# Patient Record
Sex: Female | Born: 2000 | Race: Black or African American | Hispanic: No | Marital: Single | Smoking: Never smoker
Health system: Southern US, Community
[De-identification: ages and names within clinical notes are randomized; demographics above are authoritative.]

---
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
## 2019-03-30 ENCOUNTER — Other Ambulatory Visit: Payer: Self-pay

## 2019-03-30 ENCOUNTER — Emergency Department (HOSPITAL_COMMUNITY)
Admission: EM | Admit: 2019-03-30 | Discharge: 2019-03-30 | Disposition: A | Discharge status: Home / Self Care | Payer: PRIVATE HEALTH INSURANCE | Attending: Emergency Medicine | Admitting: Emergency Medicine

## 2019-03-30 ENCOUNTER — Encounter (HOSPITAL_COMMUNITY): Payer: Self-pay

## 2019-03-30 DIAGNOSIS — M542 Cervicalgia: Secondary | ICD-10-CM | POA: Insufficient documentation

## 2019-03-30 DIAGNOSIS — R221 Localized swelling, mass and lump, neck: Secondary | ICD-10-CM | POA: Diagnosis not present

## 2019-03-30 NOTE — ED Notes (Signed)
Patient verbalizes understanding of discharge instructions. Opportunity for questioning and answers were provided. Armband removed by staff, pt discharged from ED.  

## 2019-03-30 NOTE — Discharge Instructions (Addendum)
Please try to apply warm compresses to your chin, this should help with removal of this or bringing this up to its head.  We will monitor and wait prior to starting any therapy. If you experience fever, trouble swallowing or worsening symptoms return to the ED.

## 2019-03-30 NOTE — ED Triage Notes (Signed)
Pt arrives POV for eval of chin pain/swelling onset 3 days PTA. Reports she thought it was from the way she slept, but noted it was not improving. Now c/o bump and swelling to chin x 2 days. No oral/airway involved, NARD in triage.

## 2019-03-30 NOTE — ED Provider Notes (Signed)
[facility] EMERGENCY DEPARTMENT Provider Note   CSN: [id number] Arrival date & time: 03/30/19  1242     History Chief Complaint  Patient presents with  . Neck Pain  . Chin Swelling    Peggy Swanson is a 19 y.o. female.  19 y.o female with no PMH presents to the ED with a chief complaint of "lump" under the chin x 4 days. Patient reports she first noted this after taking a nap, reports she doesn't usually lay on her back for naps therefore was unsure if this caused the lump. She has not tried any therapy for improvement in her symptoms.She reports tenderness with palpation of the wound, no difficulty swallowing, no trouble with speech, no sore throat or fevers.   The history is provided by the patient.  Neck Pain Associated symptoms: no fever        History reviewed. No pertinent past medical history.  Patient Active Problem List   Diagnosis Date Noted  . Left knee pain 03/03/2017  . Right knee pain 05/15/2016  . Shin splints, initial encounter 05/15/2016  . Low back pain 05/15/2016    History reviewed. No pertinent surgical history.   OB History    Gravida  0   Para  0   Term  0   Preterm  0   AB  0   Living  0     SAB  0   TAB  0   Ectopic  0   Multiple  0   Live Births  0           Family History  Problem Relation Age of Onset  . Diabetes Mother   . Cancer Father   . Breast cancer Paternal Aunt     Social History   Tobacco Use  . Smoking status: Never Smoker  . Smokeless tobacco: Never Used  Substance Use Topics  . Alcohol use: No  . Drug use: No    Home Medications Prior to Admission medications   Medication Sig Start Date End Date Taking? Authorizing Provider  dicyclomine (BENTYL) 20 MG tablet Take 1 tablet (20 mg total) by mouth 2 (two) times daily. 07/25/18   Elvina Sidle, MD  famotidine (PEPCID) 20 MG tablet Take 1 tablet (20 mg total) by mouth 2 (two) times daily. 07/25/18   Elvina Sidle, MD    omeprazole (PRILOSEC) 20 MG capsule Take 1 capsule (20 mg total) by mouth daily. 07/26/18   Palumbo, April, MD  sucralfate (CARAFATE) 1 g tablet Take 1 tablet (1 g total) by mouth 4 (four) times daily -  with meals and at bedtime for 7 days. 07/29/18 08/05/18  Virgina Norfolk, DO    Allergies    Patient has no known allergies.  Review of Systems   Review of Systems  Constitutional: Negative for fever.  HENT: Negative for trouble swallowing.   Respiratory: Negative for shortness of breath.   Musculoskeletal: Positive for neck pain.  Skin: Negative for color change and wound.    Physical Exam Updated Vital Signs BP 114/75 (BP Location: Right Arm)   Pulse (!) 59   Temp 98.1 F (36.7 C)   Resp 18   Ht 5' 10.5" (1.791 m)   Wt 67.1 kg   SpO2 100%   BMI 20.94 kg/m   Physical Exam Vitals and nursing note reviewed.  Constitutional:      Appearance: Normal appearance.  HENT:     Head: Normocephalic and atraumatic.  Comments: No erythema, no swelling, no  Wound noted, palpable firm movable lesion noted.     Nose: Nose normal.     Mouth/Throat:     Mouth: Mucous membranes are moist.     Comments: Pharynx is clear, uvula is midline, no tonsillar exudates, no PTA noted.  Eyes:     Pupils: Pupils are equal, round, and reactive to light.  Cardiovascular:     Rate and Rhythm: Normal rate.  Pulmonary:     Effort: Pulmonary effort is normal.     Breath sounds: No wheezing.  Abdominal:     General: Abdomen is flat.  Musculoskeletal:     Cervical back: Normal range of motion and neck supple. No rigidity.  Lymphadenopathy:     Cervical: No cervical adenopathy.  Skin:    General: Skin is warm and dry.  Neurological:     Mental Status: She is alert and oriented to person, place, and time.     ED Results / Procedures / Treatments   Labs (all labs ordered are listed, but only abnormal results are displayed) Labs Reviewed - No data to display  EKG None  Radiology No  results found.  Procedures Procedures (including critical care time)  Medications Ordered in ED Medications - No data to display  ED Course  I have reviewed the triage vital signs and the nursing notes.  Pertinent labs & imaging results that were available during my care of the patient were reviewed by me and considered in my medical decision making (see chart for details).    MDM Rules/Calculators/A&P   Doing a pertinent past medical history presents to the ED with complaints of lesion under her chin.  Reports this is tender to palpation, there is no erythema, swelling, or edema noted on my exam.  Phonation is intact, oropharynx is clear without any swelling, uvula remains midline.   She is able to speak in full sentences, no fevers, neck rigidity, cervical adenopathy.  Suspicion of reactive sublingual node versus abscess.  No head present, no Lakshman's noted.    Advised patient to apply warm compresses to the area along with monitor the watch and wait.  She is nontoxic-appearing, afebrile, will have her follow up with PCP or return to the ED if worsen. Return precautions discussed at length.    Portions of this note were generated with [employer]. Dictation errors may occur despite best attempts at proofreading.  Final Clinical Impression(s) / ED Diagnoses Final diagnoses:  Neck pain    Rx / DC Orders ED Discharge Orders    None       Janeece Fitting, PA-C 03/30/19 Krakow, Adam, DO 03/30/19 1513

## 2019-09-11 ENCOUNTER — Other Ambulatory Visit: Payer: Self-pay

## 2019-09-11 DIAGNOSIS — R064 Hyperventilation: Secondary | ICD-10-CM | POA: Insufficient documentation

## 2019-09-11 DIAGNOSIS — Z5321 Procedure and treatment not carried out due to patient leaving prior to being seen by health care provider: Secondary | ICD-10-CM | POA: Insufficient documentation

## 2019-09-11 DIAGNOSIS — F419 Anxiety disorder, unspecified: Secondary | ICD-10-CM | POA: Diagnosis present

## 2019-09-11 DIAGNOSIS — R252 Cramp and spasm: Secondary | ICD-10-CM | POA: Diagnosis not present

## 2019-09-11 NOTE — ED Triage Notes (Signed)
Pt with history of anxiety attacks.

## 2019-09-11 NOTE — ED Triage Notes (Addendum)
Pt arrives with ptar with anxiety and post hyperventilation. Pt with cramping in hands noted. Ems states pt was hyperventilating. Pt is calm at this time, but continues to complain of hand cramps. Pt denies chest pain.

## 2019-09-12 ENCOUNTER — Emergency Department
Admission: EM | Admit: 2019-09-12 | Discharge: 2019-09-12 | Disposition: A | Discharge status: Home / Self Care | Payer: Medicaid Other | Attending: Emergency Medicine | Admitting: Emergency Medicine

## 2019-09-12 NOTE — ED Notes (Signed)
Observed walking out of the ED lobby, did not speak to staff.

## 2019-09-12 NOTE — ED Notes (Signed)
Patient not in lobby or outside.

## 2019-09-12 NOTE — ED Notes (Signed)
Patient has not returned to lobby

## 2020-06-01 IMAGING — CT CT ABDOMEN AND PELVIS WITH CONTRAST
2 of 4 series · 17 of 46 positions shown, 19 images · IV contrast (APPLIED)
Comparison: 01/08/2017

CLINICAL DATA: Right lower quadrant pain on and off for 1 week

EXAM:
CT ABDOMEN AND PELVIS WITH CONTRAST
TECHNIQUE: Multidetector CT imaging of the abdomen and pelvis was performed
using the standard protocol following bolus administration of
intravenous contrast.
CONTRAST:  100mL OMNIPAQUE IOHEXOL 300 MG/ML  SOLN

[Series 2: axial st · axial · 0.66mm/px · z∈[-452,-42]mm · 14 of 90 slices shown, 16 images]
[im 4/90  soft-tissue]
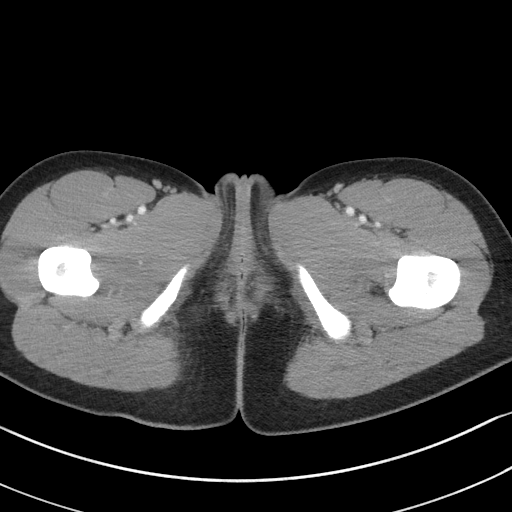
[im 4/90  bone]
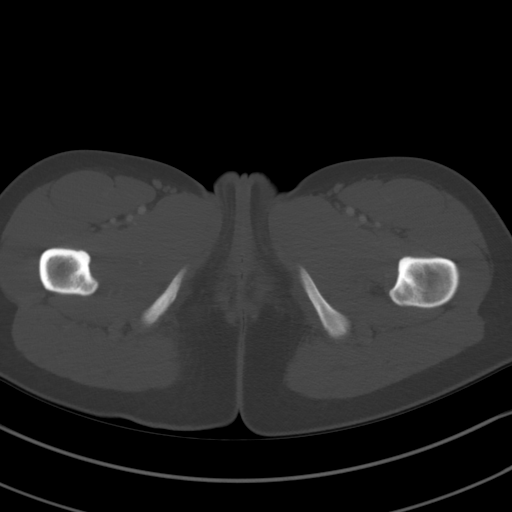
[im 10/90  soft-tissue]
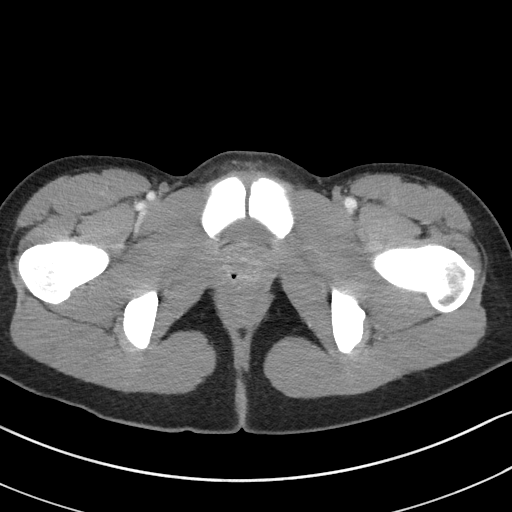
[im 16/90  soft-tissue]
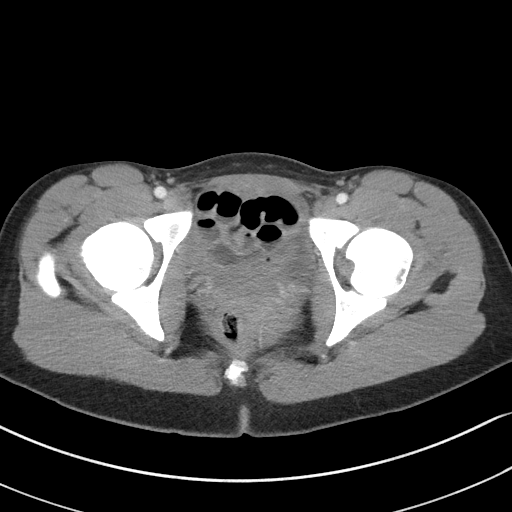
[im 23/90  soft-tissue]
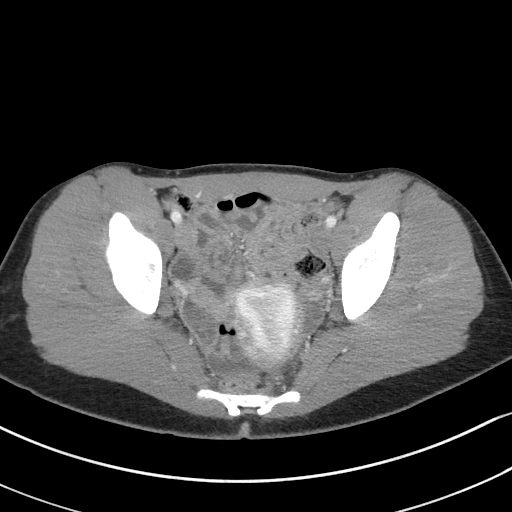
[im 29/90  soft-tissue]
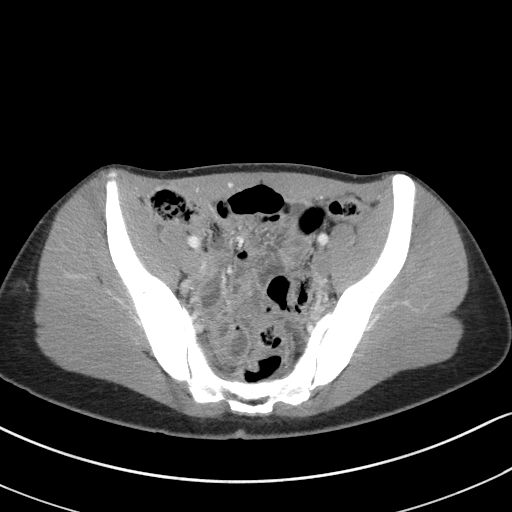
[im 35/90  soft-tissue]
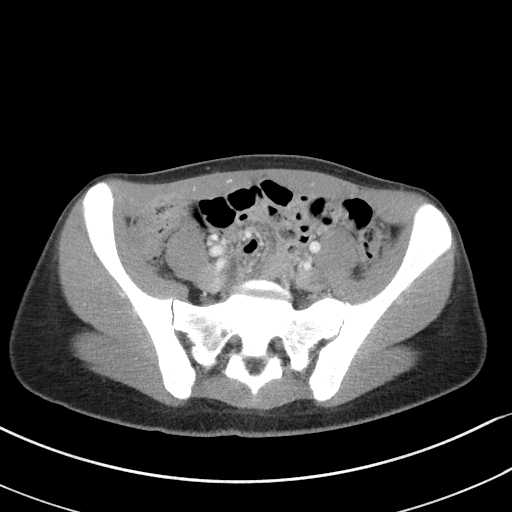
[im 42/90  soft-tissue]
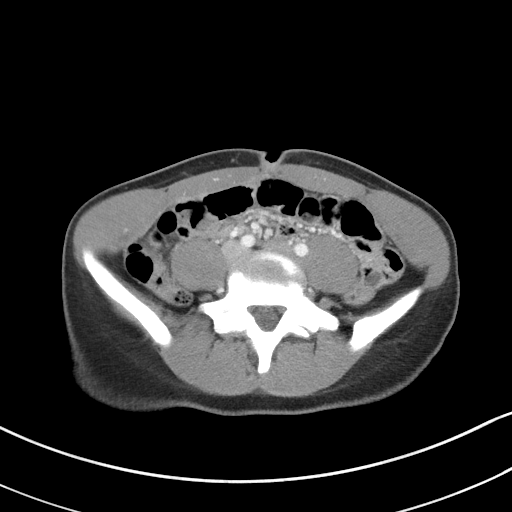
[im 48/90  soft-tissue]
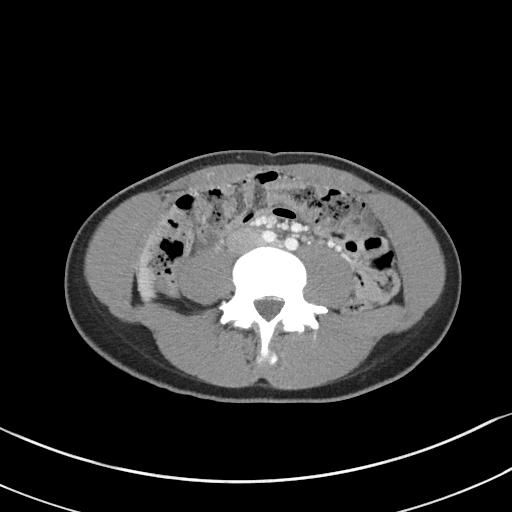
[im 55/90  soft-tissue]
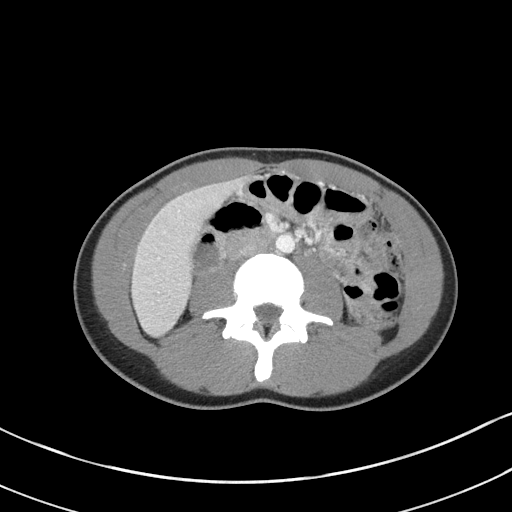
[im 55/90  bone]
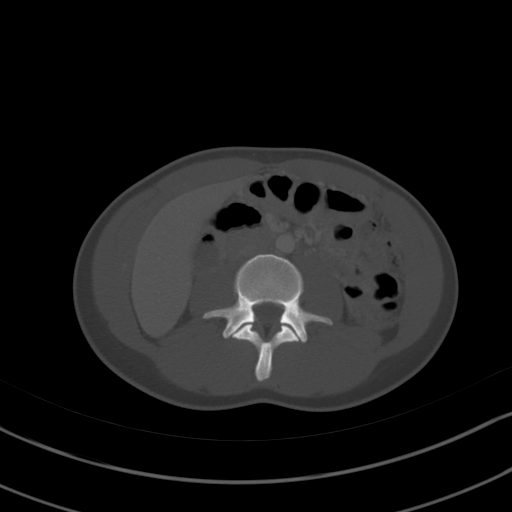
[im 61/90  soft-tissue]
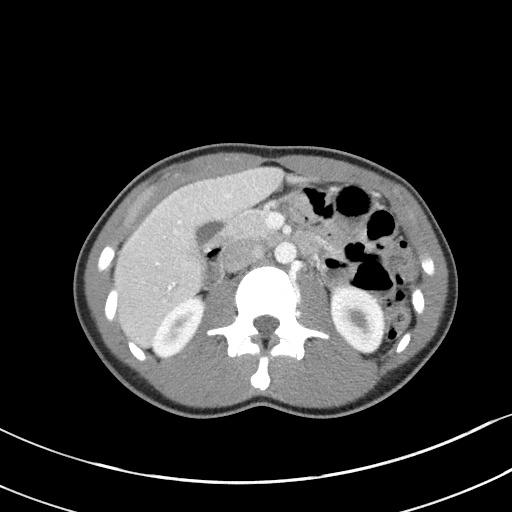
[im 67/90  soft-tissue]
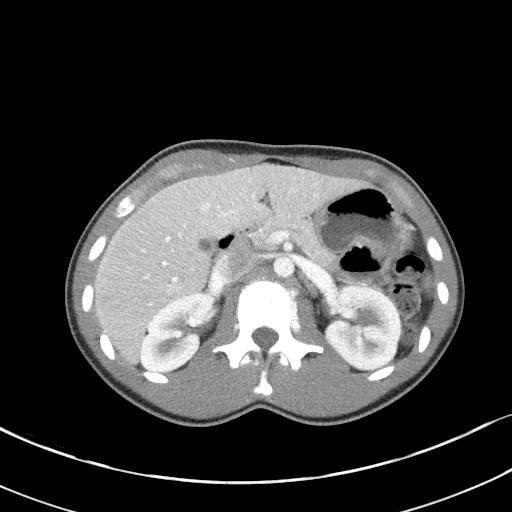
[im 74/90  soft-tissue]
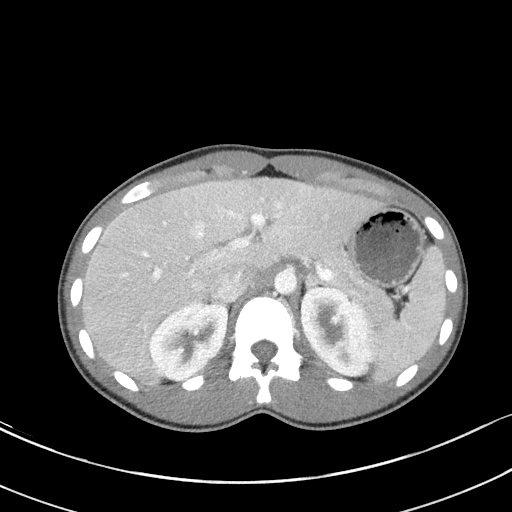
[im 80/90  soft-tissue]
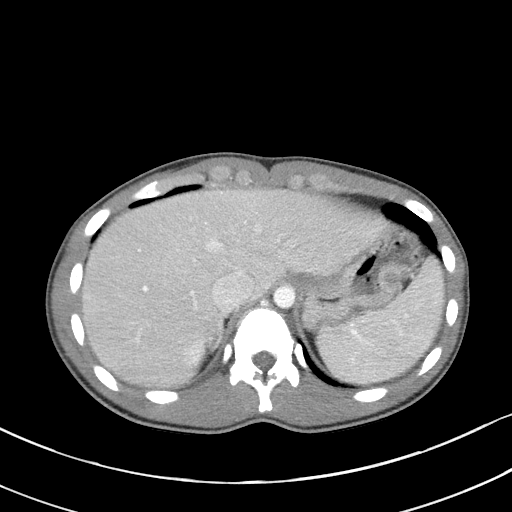
[im 86/90  soft-tissue]
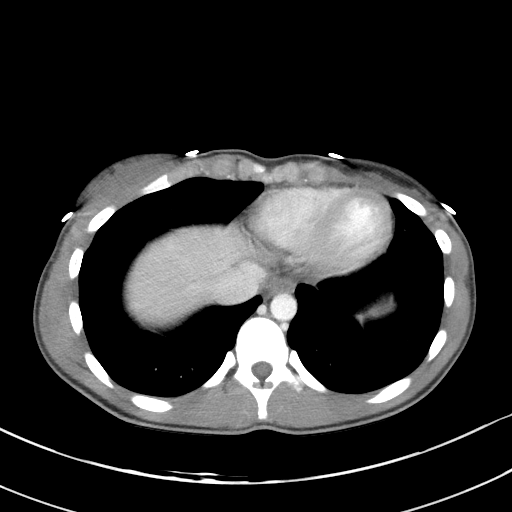

[Series 5: coronal st · coronal · 0.72mm/px · 3 of 64 slices shown]
[im 22/64  soft-tissue]
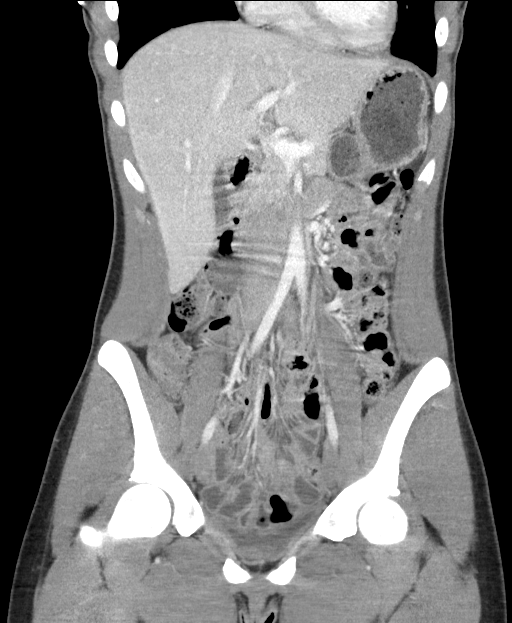
[im 29/64  soft-tissue]
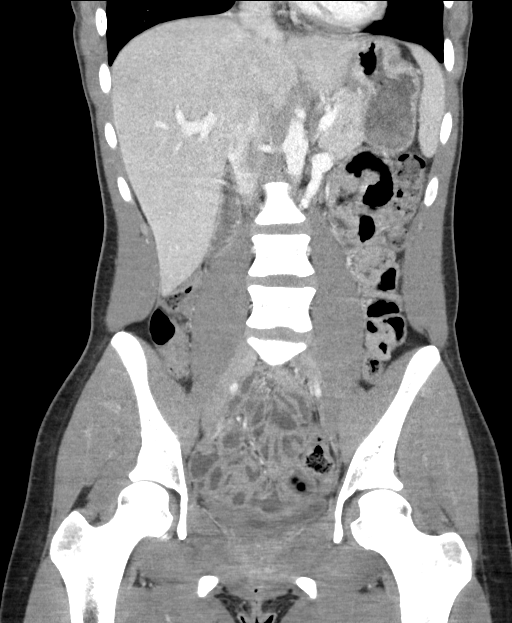
[im 36/64  soft-tissue]
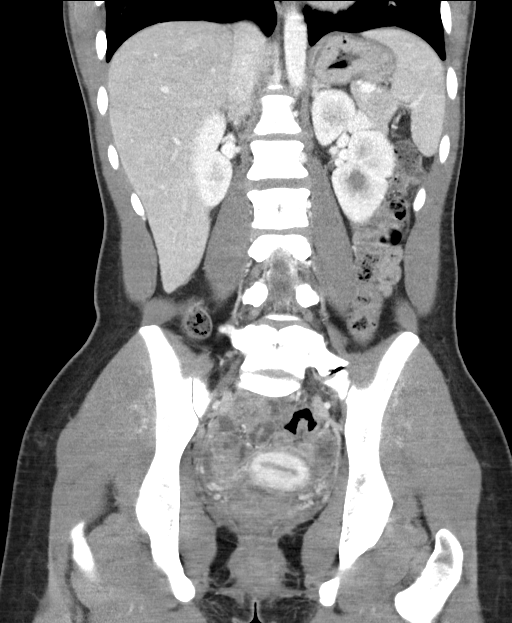

[17 of 46 positions shown; findings below may reference images not displayed]

FINDINGS: Lower chest:  No contributory findings.

Hepatobiliary: No focal liver abnormality.No evidence of biliary
obstruction or stone.

Pancreas: Unremarkable.

Spleen: Unremarkable.

Adrenals/Urinary Tract: Negative adrenals. No hydronephrosis or
stone. Unremarkable bladder.

Stomach/Bowel: No obstruction. Best seen on axial slices the
appendix is noted in the right lower quadrant and negative for
inflammation.

Vascular/Lymphatic: No acute vascular abnormality. No mass or
adenopathy.

Reproductive:No pathologic findings.

Other: No ascites or pneumoperitoneum.

Musculoskeletal: No acute abnormalities.
IMPRESSION: Negative abdominal CT.

## 2020-07-30 ENCOUNTER — Emergency Department (HOSPITAL_COMMUNITY)
Admission: EM | Admit: 2020-07-30 | Discharge: 2020-07-30 | Disposition: A | Discharge status: Home / Self Care | Payer: 59 | Attending: Emergency Medicine | Admitting: Emergency Medicine

## 2020-07-30 ENCOUNTER — Other Ambulatory Visit: Payer: Self-pay

## 2020-07-30 ENCOUNTER — Encounter (HOSPITAL_COMMUNITY): Payer: Self-pay

## 2020-07-30 DIAGNOSIS — R319 Hematuria, unspecified: Secondary | ICD-10-CM | POA: Insufficient documentation

## 2020-07-30 DIAGNOSIS — R309 Painful micturition, unspecified: Secondary | ICD-10-CM | POA: Insufficient documentation

## 2020-07-30 DIAGNOSIS — N898 Other specified noninflammatory disorders of vagina: Secondary | ICD-10-CM | POA: Insufficient documentation

## 2020-07-30 DIAGNOSIS — R300 Dysuria: Secondary | ICD-10-CM

## 2020-07-30 LAB — I-STAT BETA HCG BLOOD, ED (MC, WL, AP ONLY): I-stat hCG, quantitative: <5 m[IU]/mL (ref <5)

## 2020-07-30 LAB — URINALYSIS, ROUTINE W REFLEX MICROSCOPIC
Bacteria, UA: NONE SEEN
Bilirubin Urine: NEGATIVE
Glucose, UA: NEGATIVE mg/dL
Ketones, ur: 5 mg/dL — AB
Leukocytes,Ua: NEGATIVE
Nitrite: NEGATIVE
Protein, ur: NEGATIVE mg/dL
Specific Gravity, Urine: 1.017 (ref 1.005–1.030)
pH: 5 (ref 5.0–8.0)

## 2020-07-30 MED ORDER — CEPHALEXIN 500 MG PO CAPS
500.0000 mg | ORAL_CAPSULE | Freq: Once | ORAL | Status: AC
  Administered 2020-07-30: 500 mg via ORAL
  Filled 2020-07-30: qty 1

## 2020-07-30 MED ORDER — CEPHALEXIN 500 MG PO CAPS: 500.0000 mg | ORAL_CAPSULE | Freq: Four times a day (QID) | ORAL | 0 refills | Status: AC

## 2020-07-30 NOTE — ED Notes (Signed)
Light green and Lavender top tube sent to lab. Urine culture sent to lab.

## 2020-07-30 NOTE — ED Triage Notes (Signed)
Patient c/o urinary frequency, foul odor to her urine, and saw a small amount of blood when she wiped after urinating. Symptoms started yesterday.

## 2020-07-30 NOTE — ED Provider Notes (Signed)
[facility]-EMERGENCY DEPT Provider Note   CSN: [id number] Arrival date & time: 07/30/20  1748     History Chief Complaint  Patient presents with   Urinary Frequency   Hematuria    Peggy Swanson is a 20 y.o. female who presents with concern for urinary frequency, urgency, decreased urine, and foul odor to her urine x24 hours.  States that this afternoon she noticed scant amount of blood on the toilet tissue when she wiped after urinating.  Additionally is endorsing pain with urination while in the emergency department.  The states he feels similar to when she had a UTI in the past.  She denies any vaginal bleeding or discharge, most recent period was approximately 3 weeks ago.  Denies any fevers, chills, nausea, vomiting, diarrhea.  I personally reviewed this patient's medical records.  She has history of musculoskeletal injuries, is a Engineer, building services in college, otherwise carries no medical diagnoses and is not on any medications every day.  HPI     History reviewed. No pertinent past medical history.  Patient Active Problem List   Diagnosis Date Noted   Left knee pain 03/03/2017   Right knee pain 05/15/2016   Shin splints, initial encounter 05/15/2016   Low back pain 05/15/2016    History reviewed. No pertinent surgical history.   OB History     Gravida  0   Para  0   Term  0   Preterm  0   AB  0   Living  0      SAB  0   IAB  0   Ectopic  0   Multiple  0   Live Births  0           Family History  Problem Relation Age of Onset   Diabetes Mother    Cancer Father    Breast cancer Paternal Aunt     Social History   Tobacco Use   Smoking status: Never   Smokeless tobacco: Never  Vaping Use   Vaping Use: Never used  Substance Use Topics   Alcohol use: No   Drug use: No    Home Medications Prior to Admission medications   Medication Sig Start Date End Date Taking? Authorizing Provider  cephALEXin (KEFLEX) 500 MG  capsule Take 1 capsule (500 mg total) by mouth 4 (four) times daily for 5 days. 07/30/20 08/04/20 Yes Amardeep Beckers, Eugene Gavia, PA-C  dicyclomine (BENTYL) 20 MG tablet Take 1 tablet (20 mg total) by mouth 2 (two) times daily. 07/25/18   Elvina Sidle, MD  famotidine (PEPCID) 20 MG tablet Take 1 tablet (20 mg total) by mouth 2 (two) times daily. 07/25/18   Elvina Sidle, MD  omeprazole (PRILOSEC) 20 MG capsule Take 1 capsule (20 mg total) by mouth daily. 07/26/18   Palumbo, April, MD  sucralfate (CARAFATE) 1 g tablet Take 1 tablet (1 g total) by mouth 4 (four) times daily -  with meals and at bedtime for 7 days. 07/29/18 08/05/18  Virgina Norfolk, DO    Allergies    Patient has no known allergies.  Review of Systems   Review of Systems  Constitutional: Negative.   HENT: Negative.    Respiratory: Negative.    Cardiovascular: Negative.   Gastrointestinal: Negative.   Genitourinary:  Positive for decreased urine volume, dysuria, frequency, hematuria and urgency. Negative for difficulty urinating, dyspareunia, enuresis, flank pain, genital sores, menstrual problem, pelvic pain, vaginal bleeding, vaginal discharge and vaginal pain.  Musculoskeletal: Negative.  Neurological: Negative.    Physical Exam Updated Vital Signs BP 127/83 (BP Location: Right Arm)   Pulse 70   Temp 98.5 F (36.9 C) (Oral)   Resp 18   Ht 5\' 10"  (1.778 m)   Wt 65.8 kg   LMP 07/04/2020 (Approximate)   SpO2 100%   BMI 20.81 kg/m   Physical Exam Vitals and nursing note reviewed.  Constitutional:      Appearance: She is normal weight. She is not ill-appearing, toxic-appearing or diaphoretic.  HENT:     Head: Normocephalic and atraumatic.     Mouth/Throat:     Mouth: Mucous membranes are moist.     Pharynx: No oropharyngeal exudate or posterior oropharyngeal erythema.  Eyes:     General:        Right eye: No discharge.        Left eye: No discharge.     Extraocular Movements: Extraocular movements intact.      Conjunctiva/sclera: Conjunctivae normal.     Pupils: Pupils are equal, round, and reactive to light.  Cardiovascular:     Rate and Rhythm: Normal rate and regular rhythm.     Pulses: Normal pulses.     Heart sounds: Normal heart sounds. No murmur heard. Pulmonary:     Effort: Pulmonary effort is normal. No respiratory distress.     Breath sounds: Normal breath sounds. No wheezing or rales.  Abdominal:     General: Bowel sounds are normal. There is no distension.     Palpations: Abdomen is soft.     Tenderness: There is abdominal tenderness in the suprapubic area. There is no guarding or rebound.  Musculoskeletal:        General: No deformity.     Cervical back: Neck supple. No rigidity.     Right lower leg: No edema.     Left lower leg: No edema.  Skin:    General: Skin is warm and dry.     Capillary Refill: Capillary refill takes less than 2 seconds.  Neurological:     General: No focal deficit present.     Mental Status: She is alert and oriented to person, place, and time. Mental status is at baseline.  Psychiatric:        Mood and Affect: Mood normal.    ED Results / Procedures / Treatments   Labs (all labs ordered are listed, but only abnormal results are displayed) Labs Reviewed  URINALYSIS, ROUTINE W REFLEX MICROSCOPIC - Abnormal; Notable for the following components:      Result Value   APPearance HAZY (*)    Hgb urine dipstick MODERATE (*)    Ketones, ur 5 (*)    All other components within normal limits  I-STAT BETA HCG BLOOD, ED ([facility], [facility], [facility] ONLY)    EKG None  Radiology No results found.  Procedures Procedures   Medications Ordered in ED Medications  cephALEXin (KEFLEX) capsule 500 mg (500 mg Oral Given 07/30/20 2110)    ED Course  I have reviewed the triage vital signs and the nursing notes.  Pertinent labs & imaging results that were available during my care of the patient were reviewed by me and considered in my medical decision making (see chart  for details).    MDM Rules/Calculators/A&P                         20 year old female who presents with concern for 24 hours of dysuria with episode of hematuria.  Denies any vaginal bleeding or discharge.  Differential diagnosis includes is not mended to cystitis/pyelonephritis/STD, nephrolithiasis/ureterolithiasis, urethral injury, abnormal uterine bleeding, vaginitis.  Vital signs are normal intake.  Cardiopulmonary exam is normal, abdominal exam is significant only for mild suprapubic tenderness palpation without guarding or rebound.  Patient is neurovascularly intact in all 4 extremities.  UA reveals hazy appearing urine with moderate amount of hemoglobin and ketonuria, without evidence of bacteria or pyuria.  She is not pregnant.  STI testing and GU exam offered, however patient declined.  We will proceed with antibiotic therapy for dysuria at this time.  No further work-up warranted in the ED at this time given reassuring physical exam and vital signs.  Mykaylah voiced understanding for medical evaluation and treatment plan.  Each of her questions was answered to her expressed satisfaction.  Return precautions are given.  Patient is well-appearing, stable, and appropriate for discharge at this time.  This chart was dictated using voice recognition software, Dragon. Despite the best efforts of this provider to proofread and correct errors, errors may still occur which can change documentation meaning.  Final Clinical Impression(s) / ED Diagnoses Final diagnoses:  None    Rx / DC Orders ED Discharge Orders          Ordered    cephALEXin (KEFLEX) 500 MG capsule  4 times daily        07/30/20 2047             Averianna Brugger, Idelia Salm 07/30/20 2254    Benjiman Core, MD 07/30/20 2358

## 2020-07-30 NOTE — Discharge Instructions (Addendum)
You were seen in the ER today for your discomfort when urinating and the blood in your urine.  Your physical exam and vital signs are reassuring, however your urine test did reveal signs of early infection.  You have been prescribed antibiotic called cephalexin to take 4 times a day for the next 5 days.  Please follow-up with your OB/GYN or primary care doctor for recheck of your urine after completion of antibiotics to ensure clearance of infection.  Return to the ER if develop any nausea, vomiting, fevers, chills, or sudden severe symptoms.

## 2023-02-04 NOTE — L&D Delivery Note (Signed)
 [facility] Delivery Note: Vaginal   Review the Delivery Report for details. <redacted file path>   Peggy Swanson is a 23 y.o., now G1P0 with an estimated date of delivery of 11/16/23, who delivered at [redacted]w[redacted]d gestation. The patient is GBS negative. Patient had cervical ripening  Membrane rupture occurred at 1:55 PM on 10/28/2023, and the amniotic fluid was clear.  With none anesthesia, the patient's labor was induced with MISOPROSTOL;FOLEY/EASI;OXYTOCIN    Delivery Procedure NICU called to bedside for delivery due to [facility] Labor progressed, the fetal head descended, and was carefully delivered from a occiput anterior position. With additional maternal expulsive effort, the shoulders and remainder of the fetus were atraumatically delivered.  A controlled vaginal delivery was completed at 3:28 PM on 10/28/2023. The baby was a living , female infant, with APGAR scores of 8 at 1 min / 9 at 5 min, delivered by Vaginal, Spontaneous from an occiput anterior position.  After an appropriate delay, the cord was doubly clamped and cut. The umbilical cord was found to have 3 vessels. Skin to Skin was initiated at 3:28 PM. The placenta was spontaneousand appeared intact. Pitocin was given immediately after delivery. Fundal massage was performed, and the fundus was firm.  Episiotomy was not done. Perineum, vagina, labia, and cervix were inspected and found to have: vaginal abrasion, hemostatic.   Hemostasis was confirmed on final inspection. Final sponge, needle, and instrument counts were reported and correct.   Delivery Complications: None  Total estimated blood loss (mL): 75 Total quantitative blood loss (mL):    The patient tolerated the delivery well and is in good and stable condition.  Electronically Signed: Anette DELENA Sharps, CNM 10/28/2023 / 3:49 PM

## 2023-05-25 NOTE — Progress Notes (Signed)
 RETURN OB VISIT SUMMARY  23 y.o. G1P0 at [redacted]w[redacted]d  SUBJECTIVE: The patient has no unusual complaints.  OBJECTIVE: Physical Exam: SEE PRENATAL FLOWSHEET Chaperone present for exam: Maceo Spell, CMA GENERAL APPEARANCE: alert, well appearing, in no apparent distress ABDOMEN: FHT present EXTERNAL GENITALIA: normal appearing vulva with no masses, tenderness or lesions VAGINA: no abnormal discharge or lesions CERVIX: no lesions or cervical motion tenderness  ASSESSMENT: 1. Encounter for prenatal care in second trimester of first pregnancy (*)   2. Sickle cell trait     PLAN: Routine prenatal care Mat21 WNL, defers AFP  Pap today   RTC 4 weeks ROB with anatomy US 

## 2023-06-25 NOTE — Progress Notes (Signed)
 RETURN OB VISIT SUMMARY  23 y.o. G1P0 at [redacted]w[redacted]d   SUBJECTIVE: The patient has no unusual complaints.  OBJECTIVE: Physical Exam: SEE PRENATAL FLOWSHEET  ASSESSMENT: Normal pregnancy  PLAN: Routine prenatal care  Declines AFP

## 2023-07-23 NOTE — Progress Notes (Signed)
 RETURN OB VISIT SUMMARY  23 y.o. G1P0 at [redacted]w[redacted]d   SUBJECTIVE: The patient has no unusual complaints.  OBJECTIVE: Physical Exam: SEE PRENATAL FLOWSHEET GENERAL APPEARANCE: alert, well appearing, in no apparent distress ABDOMEN: FHT present  ASSESSMENT: Normal pregnancy  PLAN: Routine prenatal care  RTO 4wk ROB, glucola, f/u anatomy US 

## 2023-08-20 NOTE — Progress Notes (Signed)
 RETURN OB VISIT SUMMARY  23 y.o. G1P0 at [redacted]w[redacted]d -- High risk pregnancy due to  1. Prenatal care, third trimester (*)   2. Poor fetal growth affecting management of mother in third trimester, single or unspecified fetus (*)      SUBJECTIVE: The patient has no unusual complaints.  OBJECTIVE: Physical Exam: Chaperone present for pelvic exam (if performed): n/a SEE PRENATAL FLOWSHEET  ASSESSMENT: 1. Prenatal care, third trimester (*)   2. Poor fetal growth affecting management of mother in third trimester, single or unspecified fetus (*)     PLAN: Routine prenatal care Follow up dopplers/BPP in a week

## 2023-08-28 NOTE — Progress Notes (Signed)
 RETURN OB VISIT SUMMARY  23 y.o. G1P0 at [redacted]w[redacted]d -- High risk pregnancy   SUBJECTIVE: The patient has no unusual complaints.  OBJECTIVE: Physical Exam: SEE PRENATAL FLOWSHEET  ASSESSMENT: 1. Poor fetal growth affecting management of mother in third trimester, single or unspecified fetus (*)   2. Prenatal care, third trimester (*)   3. Sickle cell trait     PLAN: Routine prenatal care Cord dopplers and AFI 1 week

## 2023-09-18 NOTE — Progress Notes (Signed)
 RETURN OB VISIT SUMMARY  23 y.o. G1P0 at [redacted]w[redacted]d -- High risk pregnancy  SUBJECTIVE: The patient has no unusual complaints.  OBJECTIVE: Physical Exam: SEE PRENATAL FLOWSHEET  ASSESSMENT: 1. Poor fetal growth affecting management of mother in third trimester, single or unspecified fetus (*)     PLAN: Routine prenatal care BPP 6/8 (-2 breathing) normal cord dopplers NST: reactive  BPP and cord dopplers 1 week

## 2023-10-01 NOTE — Progress Notes (Signed)
 RETURN OB VISIT SUMMARY  23 y.o. G1P0 at [redacted]w[redacted]d -- High risk pregnancy due to  1. Prenatal care, third trimester (*)   2. Poor fetal growth affecting management of mother in third trimester, single or unspecified fetus (*)      SUBJECTIVE: The patient has no unusual complaints.  OBJECTIVE: Physical Exam: Chaperone present for pelvic exam (if performed): n/a SEE PRENATAL FLOWSHEET  ASSESSMENT: 1. Prenatal care, third trimester (*)   2. Poor fetal growth affecting management of mother in third trimester, single or unspecified fetus (*)     PLAN: Routine prenatal care 10% EFW, but AC 3%.  Will continue with weekly dopplers/BPP.  Follow up growth 3 weeks.  Anticipate IOL at 37w

## 2023-10-08 NOTE — Progress Notes (Signed)
 RETURN OB VISIT SUMMARY  23 y.o. G1P0 at [redacted]w[redacted]d -- High risk pregnancy  SUBJECTIVE: The patient has no unusual complaints.  OBJECTIVE: Physical Exam: SEE PRENATAL FLOWSHEET  ASSESSMENT: 1. Prenatal care, third trimester (*)   2. Poor fetal growth affecting management of mother in third trimester, single or unspecified fetus (*)     PLAN: Routine prenatal care BPP and dopplers 1 week

## 2023-10-15 NOTE — Progress Notes (Signed)
 RETURN OB VISIT SUMMARY  23 y.o. G1P0 at [redacted]w[redacted]d -- High risk pregnancy due to: 1. Prenatal care, third trimester (*)   2. Poor fetal growth affecting management of mother in third trimester, single or unspecified fetus (*)     SUBJECTIVE: The patient reports normal fetal movements, has no unusual complaints.  OBJECTIVE: Physical Exam: Chaperone present for pelvic exam (if performed): N/A SEE PRENATAL FLOWSHEET GENERAL APPEARANCE: alert, well appearing, in no apparent distress ABDOMEN: soft, nontender, nondistended, no abnormal masses, no epigastric pain and FHT present  ASSESSMENT: 1. Prenatal care, third trimester (*)  POCT UA VISUAL 2 OB    2. Poor fetal growth affecting management of mother in third trimester, single or unspecified fetus (*)        PLAN: Routine prenatal care US  results reviewed, 6/8 BPP, reactive NST IOL at 37 weeks due to FGR, growth US  next week.

## 2023-10-22 NOTE — Progress Notes (Signed)
 RETURN OB VISIT SUMMARY  23 y.o. G1P0 at [redacted]w[redacted]d -- High risk pregnancy   SUBJECTIVE: The patient has no unusual complaints.  OBJECTIVE: Physical Exam: SEE PRENATAL FLOWSHEET  ASSESSMENT: 1. Prenatal care, third trimester (*)   2. Poor fetal growth affecting management of mother in third trimester, single or unspecified fetus (*)     PLAN: Routine prenatal care GBBS today IOL Monday at 37 weeks Declines RSV vaccine

## 2023-10-26 NOTE — H&P (Signed)
 [facility]  History and Physical  Assessment  Active Hospital Problems   *Encounter for induction of labor (*)    Common cold    Poor fetal growth affecting management of mother in third trimester (*)    Plan  - Admit for medical IOL for FGR - Supportive care PRN for common cold, droplet precautions  Induction augmentation plan: Medically indicated induction of labor with Misoprostol (Cytotec) cervical ripening  Diagnoses present on admission include: No other complications  Fetal Presentation / Lie: Cephalic/Vertex determined by Leopold's maneuvers.  Fetal complications include: Poor fetal growth present on admission.  Plan: Monitor fetal well-being and intervene as clinically indicated  Maternal comorbidities and complicating characteristics include:  Cardiovascular: No maternal blood pressure or cardiovascular diagnosis present on admission.  Plan: Monitor and treat as indicated.  Endocrine: No maternal endocrine disorders present on admission.  Plan: Monitor and treat as indicated.  Hematologic: No maternal hematologic / blood diagnosis present on admission.  Plan: Monitor and treat as indicated.  Hepatic / Gastrointestinal:  No hepatic or gastrointestinal diagnosis present on admission.  Plan: Monitor and treat as indicated.  Infectious Disease: Rhinovirus infection present on admission.  Plan: Monitor and treat as indicated.  Behavioral Health: No maternal behavioral health diagnosis present on admission.  Plan: Monitor and treat as indicated.  Additional Maternal complicating characteristics: No adverse maternal complicating characteristics present on admission. Plan: Monitor and address as appropriate.   History  Peggy Swanson is a 23 y.o. female G1P0 at [redacted]w[redacted]d weeks with LMP 02/09/2023 with estimated due date of 11/16/2023, by Last Menstrual Period who presents for scheduled induction of labor due to FGR. Reports sinus congestion and  runny nose over the weekend. History of Present Illness As above, reports normal fetal movements.    Planned Tubal Ligation:      No OB History  Gravida Para Term Preterm AB Living  1 0 0 0 0 0  SAB IAB Ectopic Multiple Live Births  0 0 0 0 0    # Outcome Date GA Lbr Len/2nd Weight Sex Type Anes PTL Lv  1 Current            Past Medical History:  Diagnosis Date  . Asthma (*)   . Sickle cell trait    No past surgical history on file.  Allergies[1] Prior to Admission medications  Medication Sig Start Date End Date Taking? Authorizing Provider  Prenatal Vit-Fe Fumarate-FA (PRENATAL PO) Take by mouth.   Yes Historical Provider, MD   Family History  Problem Relation Age of Onset  . No Known Problems Mother    Social History[2] Review of Systems  Constitutional:  Negative for chills and fever.  HENT:  Positive for congestion, postnasal drip and rhinorrhea.   Eyes:  Negative for photophobia and visual disturbance.  Respiratory:  Negative for cough, chest tightness and shortness of breath.   Cardiovascular:  Negative for chest pain and leg swelling.  Gastrointestinal:  Negative for abdominal pain, constipation, diarrhea, nausea and vomiting.  Genitourinary:  Negative for difficulty urinating, dysuria, flank pain, genital sores, vaginal bleeding, vaginal discharge and vaginal pain.  Neurological:  Negative for dizziness and headaches.        Physical Examination  Temp:  [98.6 F (37 C)] 98.6 F (37 C) Heart Rate:  [86-90] 86 Resp:  [20] 20 BP: (131)/(76) 131/76 SpO2:  [100 %] 100 % Pain Score: 0-No pain    Latest cervical exam by OB Examiner: BHuffman, RN (10/26/23  9049) Dilation: 0 Effacement (%): 0 Station: -3          Physical Exam Vitals and nursing note reviewed. Exam conducted with a chaperone present.  Constitutional:      General: She is not in acute distress.    Appearance: Normal appearance. She is not ill-appearing.  HENT:     Head:  Normocephalic and atraumatic.  Eyes:     General:        Right eye: No discharge.        Left eye: No discharge.  Cardiovascular:     Rate and Rhythm: Normal rate and regular rhythm.  Musculoskeletal:        General: Normal range of motion.     Cervical back: Normal range of motion.  Pulmonary:     Effort: Pulmonary effort is normal. No respiratory distress.  Abdominal:     General: There is no distension.     Palpations: Abdomen is soft.     Tenderness: There is no abdominal tenderness. There is no guarding or rebound.     Comments: Appropriately gravid, fundus nontender  Skin:    General: Skin is warm and dry.  Neurological:     General: No focal deficit present.     Mental Status: She is alert.  Psychiatric:        Mood and Affect: Mood normal.        Behavior: Behavior normal.    Fetal monitoring:   Fetal Heart Rate Mode: External US  (10/26/23 1300 : Acute Interface, Incoming Flowsheet Results) Baseline Rate: 140 bpm (10/26/23 1300 : Acute Interface, Incoming Flowsheet Results) Baseline Classification: No Baseline Change (10/26/23 1300 : Acute Interface, Incoming Flowsheet Results) Variability: Moderate 6-25bpm (10/26/23 1300 : Acute Interface, Incoming Flowsheet Results) Accelerations: Absent (10/26/23 1300 : Acute Interface, Incoming Flowsheet Results) Decelerations: None (10/26/23 1300 : Acute Interface, Incoming Flowsheet Results) FHR Comments: temporary care taken over at this time (10/26/23 1241 : Acute Interface, Incoming Flowsheet Results)     Uterine activity: Mode: External (10/26/23 1300 : Acute Interface, Incoming Flowsheet Results) Contraction Frequency (min): x4 (10/26/23 1300 : Acute Interface, Incoming Flowsheet Results) Contraction Duration (sec): 55 - 120 (10/26/23 1300 : Acute Interface, Incoming Flowsheet Results)    Results  Prenatal Labs: ABO Grouping (no units)  Date Value  04/24/2023 O   Rh Factor (no units)  Date Value  04/24/2023  Positive   Antibody Screen (no units)  Date Value  10/26/2023 NEG        Hemoglobin (g/dL)  Date Value  92/82/7974 12.0  04/24/2023 12.7   HGB (gm/dL)  Date Value  90/77/7974 10.7 (L)   Hematocrit (%)  Date Value  08/20/2023 37.2  04/24/2023 38.2   HCT (%)  Date Value  10/26/2023 31.0 (L)   Protein (mg/dL)  Date Value  90/81/7974 Negative        Strep Gp B NAA (no units)  Date Value  10/22/2023 Negative          Neisseria Gonorrhoeae By PCR (no units)  Date Value  10/22/2023 Negative  04/24/2023 Negative   Chlamydia Trachomatis, NAA (no units)  Date Value  10/22/2023 Negative  04/24/2023 Negative   RPR (no units)  Date Value  10/22/2023 Non Reactive  08/20/2023 Non Reactive  04/24/2023 Non Reactive        Rubella Ab, IgG (index)  Date Value  04/24/2023 5.52     Hepatitis B Surface Ag (no units)  Date Value  04/24/2023 Negative  HIV Ab/p24 Ag Screen (no units)  Date Value  10/22/2023 Non Reactive    No results found for: PPD No results found for: TSH No results found for: HMPAP      No results found for: CF No results found for: CANAVAN No components found for: BETATHALASSEMIAHB  No results found for: SICKLE  No results found for: TAYSACHS     (Electronically Signed:)Edwin Mable, MD 10/26/2023 1:17 PM      [1] No Known Allergies [2] Social History Socioeconomic History  . Marital status: Single  Tobacco Use  . Smoking status: Never    Passive exposure: Never  . Smokeless tobacco: Never  Vaping Use  . Vaping status: Never Used  Substance and Sexual Activity  . Alcohol use: Not Currently  . Drug use: Not Currently  . Sexual activity: Yes    Partners: Male    Birth control/protection: None

## 2024-02-12 ENCOUNTER — Ambulatory Visit: Admitting: Nurse Practitioner

## 2024-03-23 ENCOUNTER — Ambulatory Visit
